# Patient Record
Sex: Female | Born: 1980 | Race: White | Hispanic: No | Marital: Married | Smoking: Current every day smoker
Health system: Southern US, Community
[De-identification: ages and names within clinical notes are randomized; demographics above are authoritative.]

## PROBLEM LIST (undated history)

## (undated) DIAGNOSIS — N83209 Unspecified ovarian cyst, unspecified side: Secondary | ICD-10-CM

## (undated) DIAGNOSIS — T7840XA Allergy, unspecified, initial encounter: Secondary | ICD-10-CM

## (undated) DIAGNOSIS — Z973 Presence of spectacles and contact lenses: Secondary | ICD-10-CM

## (undated) DIAGNOSIS — Z87440 Personal history of urinary (tract) infections: Secondary | ICD-10-CM

## (undated) HISTORY — DX: Unspecified ovarian cyst, unspecified side: N83.209

## (undated) HISTORY — PX: WISDOM TOOTH EXTRACTION: SHX21

## (undated) HISTORY — DX: Presence of spectacles and contact lenses: Z97.3

## (undated) HISTORY — DX: Allergy, unspecified, initial encounter: T78.40XA

## (undated) HISTORY — DX: Personal history of urinary (tract) infections: Z87.440

---

## 2009-11-27 ENCOUNTER — Emergency Department (HOSPITAL_BASED_OUTPATIENT_CLINIC_OR_DEPARTMENT_OTHER): Admission: EM | Admit: 2009-11-27 | Discharge: 2009-11-27 | Payer: Self-pay | Admitting: Emergency Medicine

## 2010-09-17 LAB — DIFFERENTIAL
Eosinophils Relative: 2 % (ref 0–5)
Lymphocytes Relative: 21 % (ref 12–46)
Lymphs Abs: 1.2 10*3/uL (ref 0.7–4.0)
Monocytes Absolute: 0.5 10*3/uL (ref 0.1–1.0)
Monocytes Relative: 9 % (ref 3–12)

## 2010-09-17 LAB — URINALYSIS, ROUTINE W REFLEX MICROSCOPIC
Glucose, UA: NEGATIVE mg/dL
Protein, ur: NEGATIVE mg/dL
Specific Gravity, Urine: 1.01 (ref 1.005–1.030)
Urobilinogen, UA: 1 mg/dL (ref 0.0–1.0)

## 2010-09-17 LAB — COMPREHENSIVE METABOLIC PANEL
AST: 30 U/L (ref 0–37)
Albumin: 4 g/dL (ref 3.5–5.2)
Calcium: 9.1 mg/dL (ref 8.4–10.5)
Creatinine, Ser: 0.6 mg/dL (ref 0.4–1.2)
GFR calc Af Amer: >60 mL/min (ref >60)
Total Protein: 7.4 g/dL (ref 6.0–8.3)

## 2010-09-17 LAB — CBC
MCHC: 34.5 g/dL (ref 30.0–36.0)
MCV: 92.8 fL (ref 78.0–100.0)
Platelets: 205 10*3/uL (ref 150–400)

## 2010-09-17 LAB — D-DIMER, QUANTITATIVE: D-Dimer, Quant: 0.4 ug/mL-FEU (ref 0.00–0.48)

## 2010-09-17 LAB — URINE MICROSCOPIC-ADD ON

## 2012-01-22 ENCOUNTER — Emergency Department (HOSPITAL_BASED_OUTPATIENT_CLINIC_OR_DEPARTMENT_OTHER): Payer: BC Managed Care – PPO

## 2012-01-22 ENCOUNTER — Emergency Department (HOSPITAL_BASED_OUTPATIENT_CLINIC_OR_DEPARTMENT_OTHER)
Admission: EM | Admit: 2012-01-22 | Discharge: 2012-01-22 | Disposition: A | Discharge status: Home / Self Care | Payer: BC Managed Care – PPO | Attending: Emergency Medicine | Admitting: Emergency Medicine

## 2012-01-22 ENCOUNTER — Encounter (HOSPITAL_BASED_OUTPATIENT_CLINIC_OR_DEPARTMENT_OTHER): Payer: Self-pay | Admitting: *Deleted

## 2012-01-22 DIAGNOSIS — F17200 Nicotine dependence, unspecified, uncomplicated: Secondary | ICD-10-CM | POA: Insufficient documentation

## 2012-01-22 DIAGNOSIS — N83209 Unspecified ovarian cyst, unspecified side: Secondary | ICD-10-CM | POA: Insufficient documentation

## 2012-01-22 LAB — URINALYSIS, ROUTINE W REFLEX MICROSCOPIC
Bilirubin Urine: NEGATIVE
Nitrite: NEGATIVE
Specific Gravity, Urine: 1.007 (ref 1.005–1.030)
pH: 6.5 (ref 5.0–8.0)

## 2012-01-22 LAB — BASIC METABOLIC PANEL
GFR calc Af Amer: >90 mL/min (ref >90)
GFR calc non Af Amer: >90 mL/min (ref >90)
Glucose, Bld: 87 mg/dL (ref 70–99)
Potassium: 3.6 mEq/L (ref 3.5–5.1)
Sodium: 138 mEq/L (ref 135–145)

## 2012-01-22 LAB — WET PREP, GENITAL
WBC, Wet Prep HPF POC: NONE SEEN
Yeast Wet Prep HPF POC: NONE SEEN

## 2012-01-22 LAB — URINE MICROSCOPIC-ADD ON

## 2012-01-22 MED ORDER — SODIUM CHLORIDE 0.9 % IV BOLUS (SEPSIS)
1000.0000 mL | Freq: Once | INTRAVENOUS | Status: AC
  Administered 2012-01-22: 1000 mL via INTRAVENOUS

## 2012-01-22 MED ORDER — OXYCODONE-ACETAMINOPHEN 5-325 MG PO TABS: 1.0000 | ORAL_TABLET | Freq: Four times a day (QID) | ORAL | Status: DC | PRN

## 2012-01-22 MED ORDER — MORPHINE SULFATE 4 MG/ML IJ SOLN
4.0000 mg | Freq: Once | INTRAMUSCULAR | Status: AC
  Administered 2012-01-22: 4 mg via INTRAVENOUS
  Filled 2012-01-22: qty 1

## 2012-01-22 MED ORDER — KETOROLAC TROMETHAMINE 30 MG/ML IJ SOLN
30.0000 mg | Freq: Once | INTRAMUSCULAR | Status: AC
  Administered 2012-01-22: 30 mg via INTRAVENOUS
  Filled 2012-01-22: qty 1

## 2012-01-22 MED ORDER — ONDANSETRON HCL 4 MG/2ML IJ SOLN
4.0000 mg | Freq: Once | INTRAMUSCULAR | Status: AC
  Administered 2012-01-22: 4 mg via INTRAVENOUS
  Filled 2012-01-22: qty 2

## 2012-01-22 NOTE — ED Provider Notes (Signed)
History     CSN: [id number]  Arrival date & time 01/22/12  1715   First MD Initiated Contact with Patient 01/22/12 1726      Chief Complaint  Patient presents with  . Flank Pain    (Consider location/radiation/quality/duration/timing/severity/associated sxs/prior treatment) HPI Comments: Pt states that he was seen at urgent care yesterday and they diagnosed her with a kidney infection:pt denies history of similar symptom or fever:pt states that she is having right flank  pain  Patient is a 31 y.o. female presenting with flank pain. The history is provided by the patient. No language interpreter was used.  Flank Pain This is a new problem. The current episode started in the past 7 days. The problem occurs intermittently. The problem has been unchanged. Associated symptoms include nausea. Pertinent negatives include no abdominal pain, fever or vomiting. Nothing aggravates the symptoms. She has tried nothing for the symptoms.    History reviewed. No pertinent past medical history.  Past Surgical History  Procedure Date  . Cesarean section     History reviewed. No pertinent family history.  History  Substance Use Topics  . Smoking status: Current Everyday Smoker -- 0.5 packs/day  . Smokeless tobacco: Not on file  . Alcohol Use: No    OB History    Grav Para Term Preterm Abortions TAB SAB Ect Mult Living                  Review of Systems  Constitutional: Negative for fever.  Respiratory: Negative.   Cardiovascular: Negative.   Gastrointestinal: Positive for nausea. Negative for vomiting and abdominal pain.  Genitourinary: Positive for flank pain.    Allergies  Bactrim  Home Medications   Current Outpatient Rx  Name Route Sig Dispense Refill  . CIPROFLOXACIN HCL 500 MG PO TABS Oral Take 500 mg by mouth 2 (two) times daily.    Marland Kitchen HYDROCODONE-ACETAMINOPHEN 5-325 MG PO TABS Oral Take 1 tablet by mouth every 4 (four) hours as needed. For pain      BP 116/80   Pulse 96  Temp 97.8 F (36.6 C) (Oral)  Ht 5\' 6"  (1.676 m)  Wt 120 lb (54.432 kg)  BMI 19.37 kg/m2  SpO2 100%  LMP 01/14/2012  Physical Exam  Nursing note and vitals reviewed. Constitutional: She appears well-developed and well-nourished.  HENT:  Head: Normocephalic and atraumatic.  Eyes: Conjunctivae are normal.  Neck: Neck supple.  Cardiovascular: Regular rhythm.   Pulmonary/Chest: Effort normal and breath sounds normal.  Abdominal: Soft. Bowel sounds are normal. There is no tenderness.  Genitourinary:       -cmt:no discharge  Musculoskeletal: Normal range of motion.  Neurological: She is alert.  Skin: Skin is warm and dry.    ED Course  Procedures (including critical care time)  Labs Reviewed  URINALYSIS, ROUTINE W REFLEX MICROSCOPIC - Abnormal; Notable for the following:    Hgb urine dipstick SMALL (*)     Protein, ur 30 (*)     All other components within normal limits  WET PREP, GENITAL - Abnormal; Notable for the following:    Clue Cells Wet Prep HPF POC FEW (*)     All other components within normal limits  PREGNANCY, URINE  BASIC METABOLIC PANEL  URINE MICROSCOPIC-ADD ON  GC/CHLAMYDIA PROBE AMP, GENITAL   Ct Abdomen Pelvis Wo Contrast  01/22/2012  *RADIOLOGY REPORT*  Clinical Data: Right flank pain.  Hematuria.  CT ABDOMEN AND PELVIS WITHOUT CONTRAST  Technique:  Multidetector CT imaging of  the abdomen and pelvis was performed following the standard protocol without intravenous contrast.  Comparison: None.  Findings: There are no visible renal or ureteral calculi.  Right ureter is slightly prominent but there is no stone in the right ureter or in the bladder.  There is a 3.6 cm area of low density in the left side of the pelvis which is probably a cyst on the left ovary but the lack of oral and intravenous contrast makes is indeterminate.  There are several low density lesions in the spleen, statistically most likely benign cysts.  Liver parenchyma, pancreas, and  adrenal glands are normal.  There is no adenopathy.  No dilated loops of large or small bowel. Appendix is normal.  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  There is a subchondral cystic lesion in the superior aspect of the right acetabulum and there is a vague area of lucency in the posterior aspect of the left acetabulum.  These are felt to be benign intraosseous ganglions.  There are multiple phleboliths in the pelvis.  Bladder appears normal.  Uterus is not enlarged.  IMPRESSION:  1.  No evidence of renal or ureteral calculi. 2.  3.6 cm cyst in the left ovary.  Original Report Authenticated By: Gwynn Burly, M.D.     1. Ovarian cyst       MDM  Pt is feeling better at this time:pt is not pregnant        Teressa Lower, NP 01/22/12 1942

## 2012-01-22 NOTE — ED Notes (Signed)
Right sided back pain that she has been seen for on Monday and was told WBC normal.  Was put on Cipro for UTI.

## 2012-01-22 NOTE — ED Notes (Signed)
Pt c/o right flank pain w/ nausea, seen by UC on Monday with vicodin and cipro  W/o relief

## 2012-01-22 NOTE — ED Provider Notes (Signed)
Medical screening examination/treatment/procedure(s) were performed by non-physician practitioner and as supervising physician I was immediately available for consultation/collaboration.    Nelia Shi, MD 01/22/12 [phone]

## 2012-01-23 LAB — GC/CHLAMYDIA PROBE AMP, GENITAL: Chlamydia, DNA Probe: NEGATIVE

## 2012-01-29 ENCOUNTER — Encounter: Payer: Self-pay | Admitting: Medical

## 2012-01-29 ENCOUNTER — Ambulatory Visit (INDEPENDENT_AMBULATORY_CARE_PROVIDER_SITE_OTHER): Payer: BC Managed Care – PPO | Admitting: Medical

## 2012-01-29 VITALS — BP 120/70 | HR 106 | Temp 98.1°F | Resp 16 | Ht 66.0 in | Wt 122.0 lb

## 2012-01-29 DIAGNOSIS — R109 Unspecified abdominal pain: Secondary | ICD-10-CM

## 2012-01-29 LAB — HEPATIC FUNCTION PANEL
Alkaline Phosphatase: 40 U/L (ref 39–117)
Bilirubin, Direct: 0.1 mg/dL (ref 0.0–0.3)
Indirect Bilirubin: 0.3 mg/dL (ref 0.0–0.9)
Total Bilirubin: 0.4 mg/dL (ref 0.3–1.2)

## 2012-01-29 LAB — POCT URINALYSIS DIPSTICK
Glucose, UA: NEGATIVE
Nitrite, UA: NEGATIVE
Urobilinogen, UA: NEGATIVE

## 2012-01-29 NOTE — Progress Notes (Signed)
Subjective:   HPI  Kelly Bradshaw is a 31 y.o. female who presents as a new patient. She normally sees primary care in [place] at [facility].  She also has a gynecologist, Dr. Janae Sauce in [facility].  She is here for another opinion.  She went to urgent care last week for right flank pain.  Was treated for UTI, given Cipro and Lortab. She notes that she just felt like this was not the correct diagnosis, so she went to [facility] ED the next day.  Had CT of abdomen for hematuria and flank pain.  No stone was seen, but left sided ovarian cyst was noted as well as numerous small lesions, reportedly benign.  Labs were done as well. She went to her primary doctor and he felt that regardless of the CT, she likely still had a stone.  Was given stronger pain medication.  She ended up seeing Urology this past Friday, advised that she had neither stone or UTI, and advised that no further urology eval was needed.  Primary care subsequently set her up for gall bladder scan for this coming Monday since the diagnosis so far is a mystery.   She had a baby last fall, but denies and pelvic issue otherwise, no pelvic symptoms, vaginal discharge, no concern for STD, and had STD screening with the hospital visit.  She wants another opinion on the diagnosis and the CT findings.  No other aggravating or relieving factors.    No other c/o.  The following portions of the patient's history were reviewed and updated as appropriate: allergies, current medications, past family history, past medical history, past social history, past surgical history and problem list.  Past Medical History  Diagnosis Date  . Allergy   . History of urinary tract infection   . Wears glasses   . Ovarian cyst     Allergies  Allergen Reactions  . Bactrim (Sulfamethoxazole W-Trimethoprim) Nausea And Vomiting   Review of Systems Constitutional: -fever, -chills, -sweats, -unexpected -weight change,-fatigue ENT: -runny nose, -ear pain, -sore  throat Cardiology:  -chest pain, -palpitations, -edema Respiratory: -cough, -shortness of breath, -wheezing Gastroenterology: right flank pain, mainly in right CVA region, non radiating, -nausea, -vomiting, -diarrhea, -constipation  Hematology: -bleeding or bruising problems Musculoskeletal: -arthralgias, -myalgias, -joint swelling, -back pain Urology: -dysuria, -difficulty urinating, -hematuria, -urinary frequency, -urgency Neurology: -headache, -weakness, -tingling, -numbness       Objective:   Physical Exam  General appearance: alert, no distress, WD/WN HEENT: normocephalic, sclerae anicteric, TMs pearly, nares patent, no discharge or erythema, pharynx normal Oral cavity: MMM, no lesions Neck: supple, no lymphadenopathy, no thyromegaly, no masses Heart: RRR, normal S1, S2, no murmurs Lungs: CTA bilaterally, no wheezes, rhonchi, or rales Abdomen: +bs, soft, non tender, non distended, no masses, no hepatomegaly, no splenomegaly Pulses: 2+ symmetric, upper and lower extremities, normal cap refill   Assessment and Plan :     Encounter Diagnoses  Name Primary?  . Abdominal pain Yes  . Right flank pain    I reviewed the ED report, ED labs, and CT abdomen pelvis from this last week.  BMET normal, GC/Chlamydia normal, there was few clue cells on urine, but today's urinalysis unremarkable.  CT had several benign lesions noted - hemangioma of vertebra, acetabular cysts, left ovarian cyst, possible splenic cyts.  additional contrast imaging recommended if clinically warranted.  Discussed potential differential diagnosis.  I advised that the CT and labs don't point to a stone or infection, but in light of the mystery,  we will check liver panel and urine culture.  She will f/u Monday for gall bladder scan, presumably a RUQ ultrasound.  Advised that if pain c/t despite using pain medication and despite these additional tests, can consider CT abdomen/pelvis or MRI with contrast.  symptoms may  resolve spontaneously though.  F/u pending labs/studies.

## 2012-01-31 LAB — URINE CULTURE

## 2019-02-15 ENCOUNTER — Emergency Department (HOSPITAL_BASED_OUTPATIENT_CLINIC_OR_DEPARTMENT_OTHER)
Admission: EM | Admit: 2019-02-15 | Discharge: 2019-02-15 | Disposition: A | Discharge status: Home / Self Care | Payer: Self-pay | Attending: Emergency Medicine | Admitting: Emergency Medicine

## 2019-02-15 ENCOUNTER — Other Ambulatory Visit: Payer: Self-pay

## 2019-02-15 ENCOUNTER — Encounter (HOSPITAL_BASED_OUTPATIENT_CLINIC_OR_DEPARTMENT_OTHER): Payer: Self-pay | Admitting: *Deleted

## 2019-02-15 ENCOUNTER — Emergency Department (HOSPITAL_BASED_OUTPATIENT_CLINIC_OR_DEPARTMENT_OTHER): Payer: Self-pay

## 2019-02-15 DIAGNOSIS — Z5321 Procedure and treatment not carried out due to patient leaving prior to being seen by health care provider: Secondary | ICD-10-CM | POA: Insufficient documentation

## 2019-02-15 DIAGNOSIS — M25561 Pain in right knee: Secondary | ICD-10-CM | POA: Insufficient documentation

## 2019-02-15 NOTE — ED Triage Notes (Signed)
Pt c/o right knee injury while playing  ball x 1 hr ago

## 2019-12-11 IMAGING — CR RIGHT KNEE - COMPLETE 4+ VIEW
4 series · 4 of 4 positions shown · non-contrast
Comparison: None.

CLINICAL DATA: Medial right knee pain after sliding in the dirt.

EXAM:
RIGHT KNEE - COMPLETE 4+ VIEW

[t knee ap right]
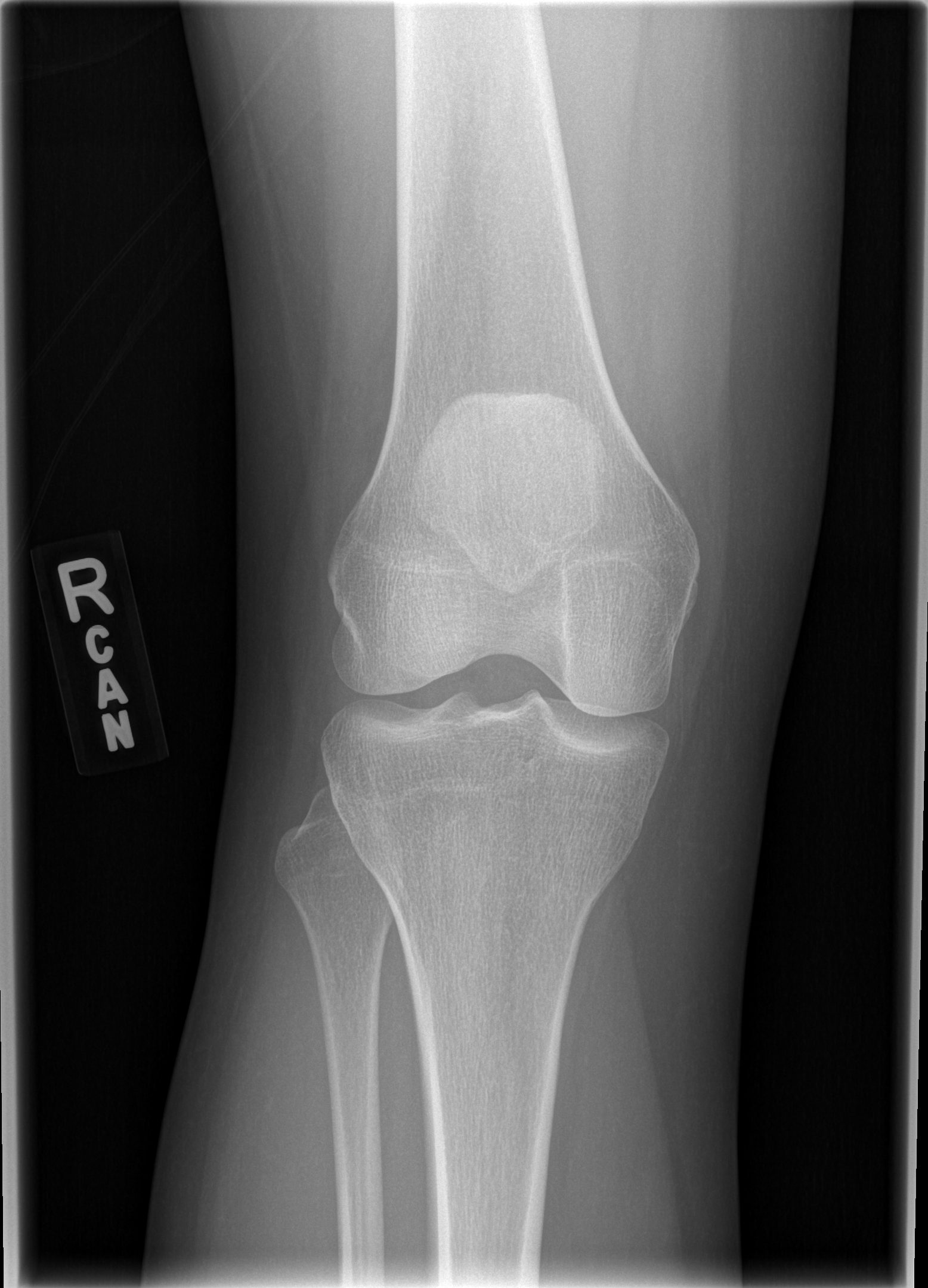

[t knee oblique right (1 of 2)]
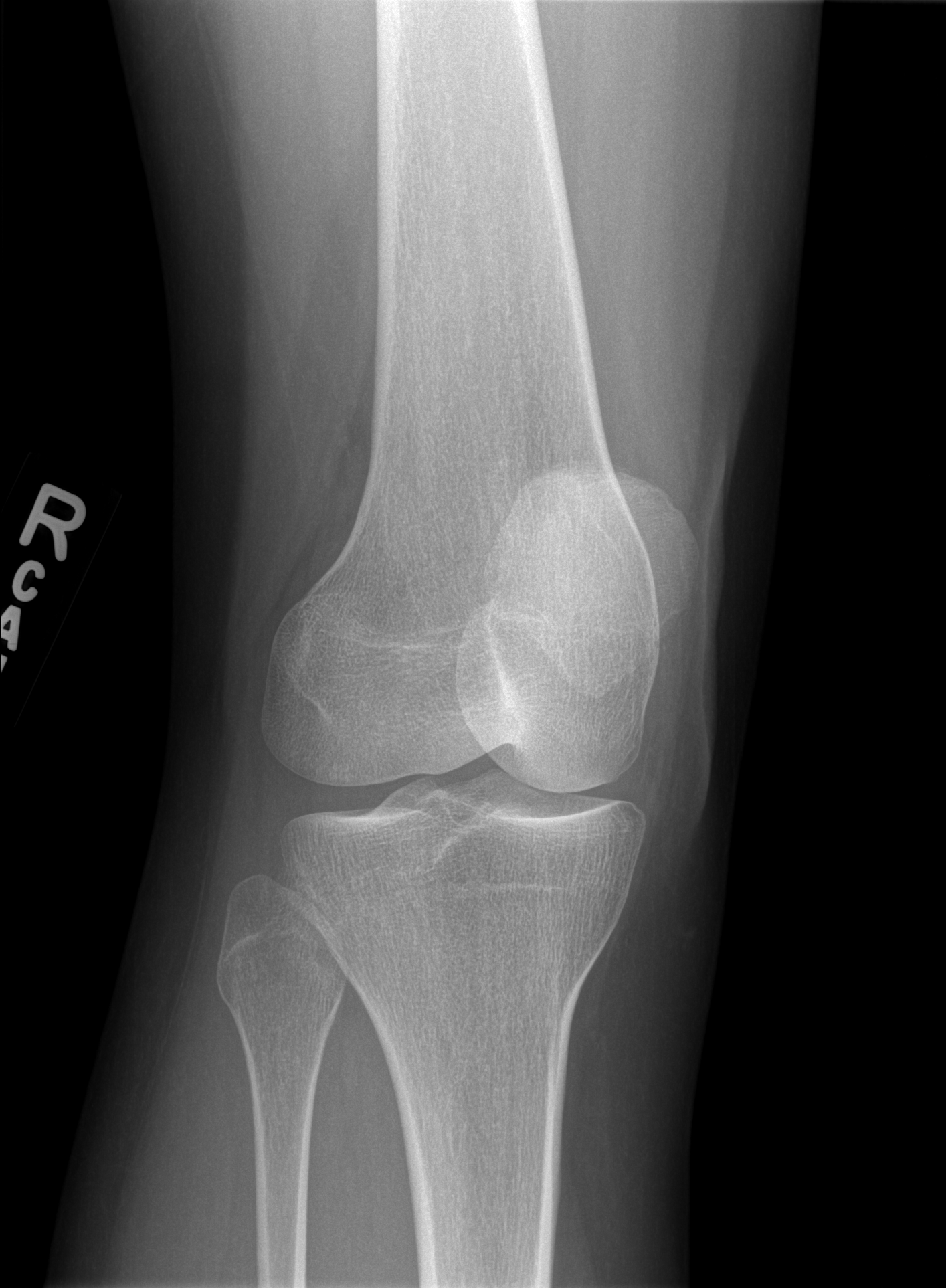

[t knee oblique right (2 of 2)]
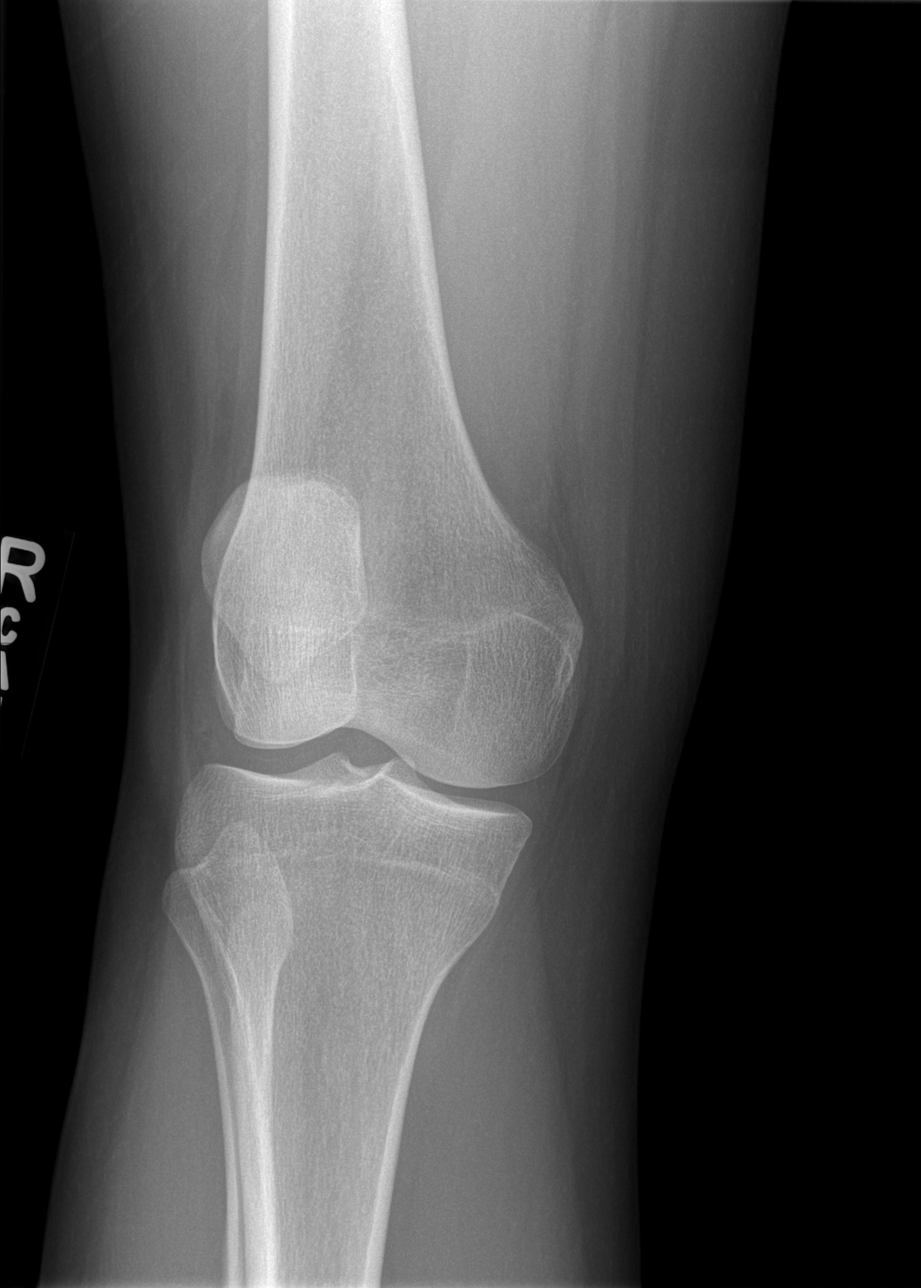

[t knee lat right]
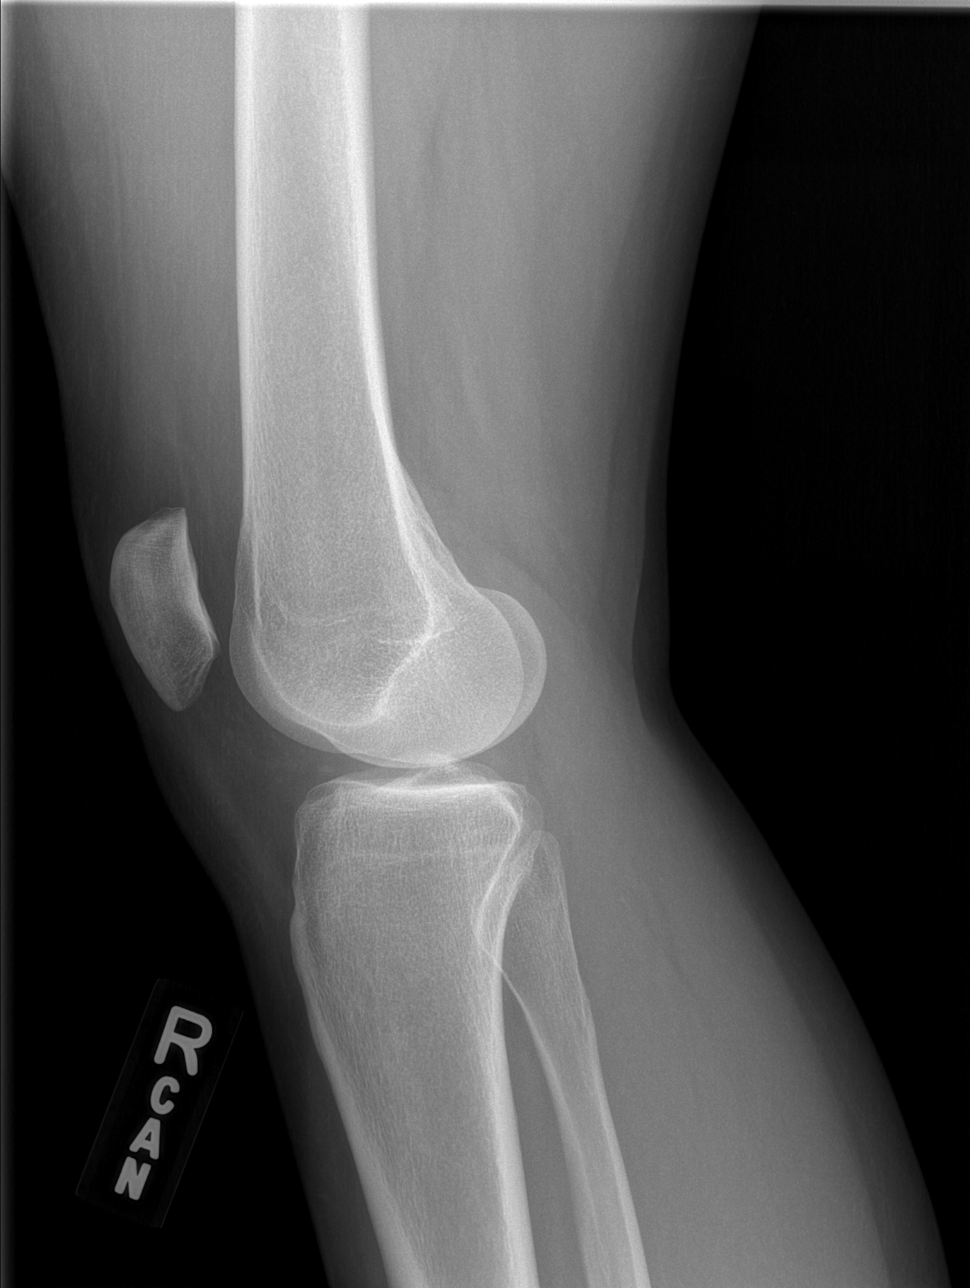

[4 of 4 positions shown; findings below may reference images not displayed]

FINDINGS: No evidence of fracture, dislocation, or joint effusion. No evidence
of arthropathy or other focal bone abnormality. Soft tissues are
unremarkable.
IMPRESSION: Negative radiographs of the right knee.
